# Patient Record
Sex: Male | Born: 2000 | Race: Black or African American | Hispanic: No | Marital: Single | Smoking: Never smoker
Health system: Southern US, Community
[De-identification: ages and names within clinical notes are randomized; demographics above are authoritative.]

## PROBLEM LIST (undated history)

## (undated) DIAGNOSIS — J45909 Unspecified asthma, uncomplicated: Secondary | ICD-10-CM

## (undated) DIAGNOSIS — F909 Attention-deficit hyperactivity disorder, unspecified type: Secondary | ICD-10-CM

## (undated) HISTORY — PX: OTHER SURGICAL HISTORY: SHX169

---
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
## 2020-09-01 IMAGING — CR DG CHEST 2V
2 series · 2 of 2 positions shown · non-contrast
Comparison: 05/30/2011

CLINICAL DATA: Chest pain and cough

EXAM:
CHEST - 2 VIEW

[w chest pa]
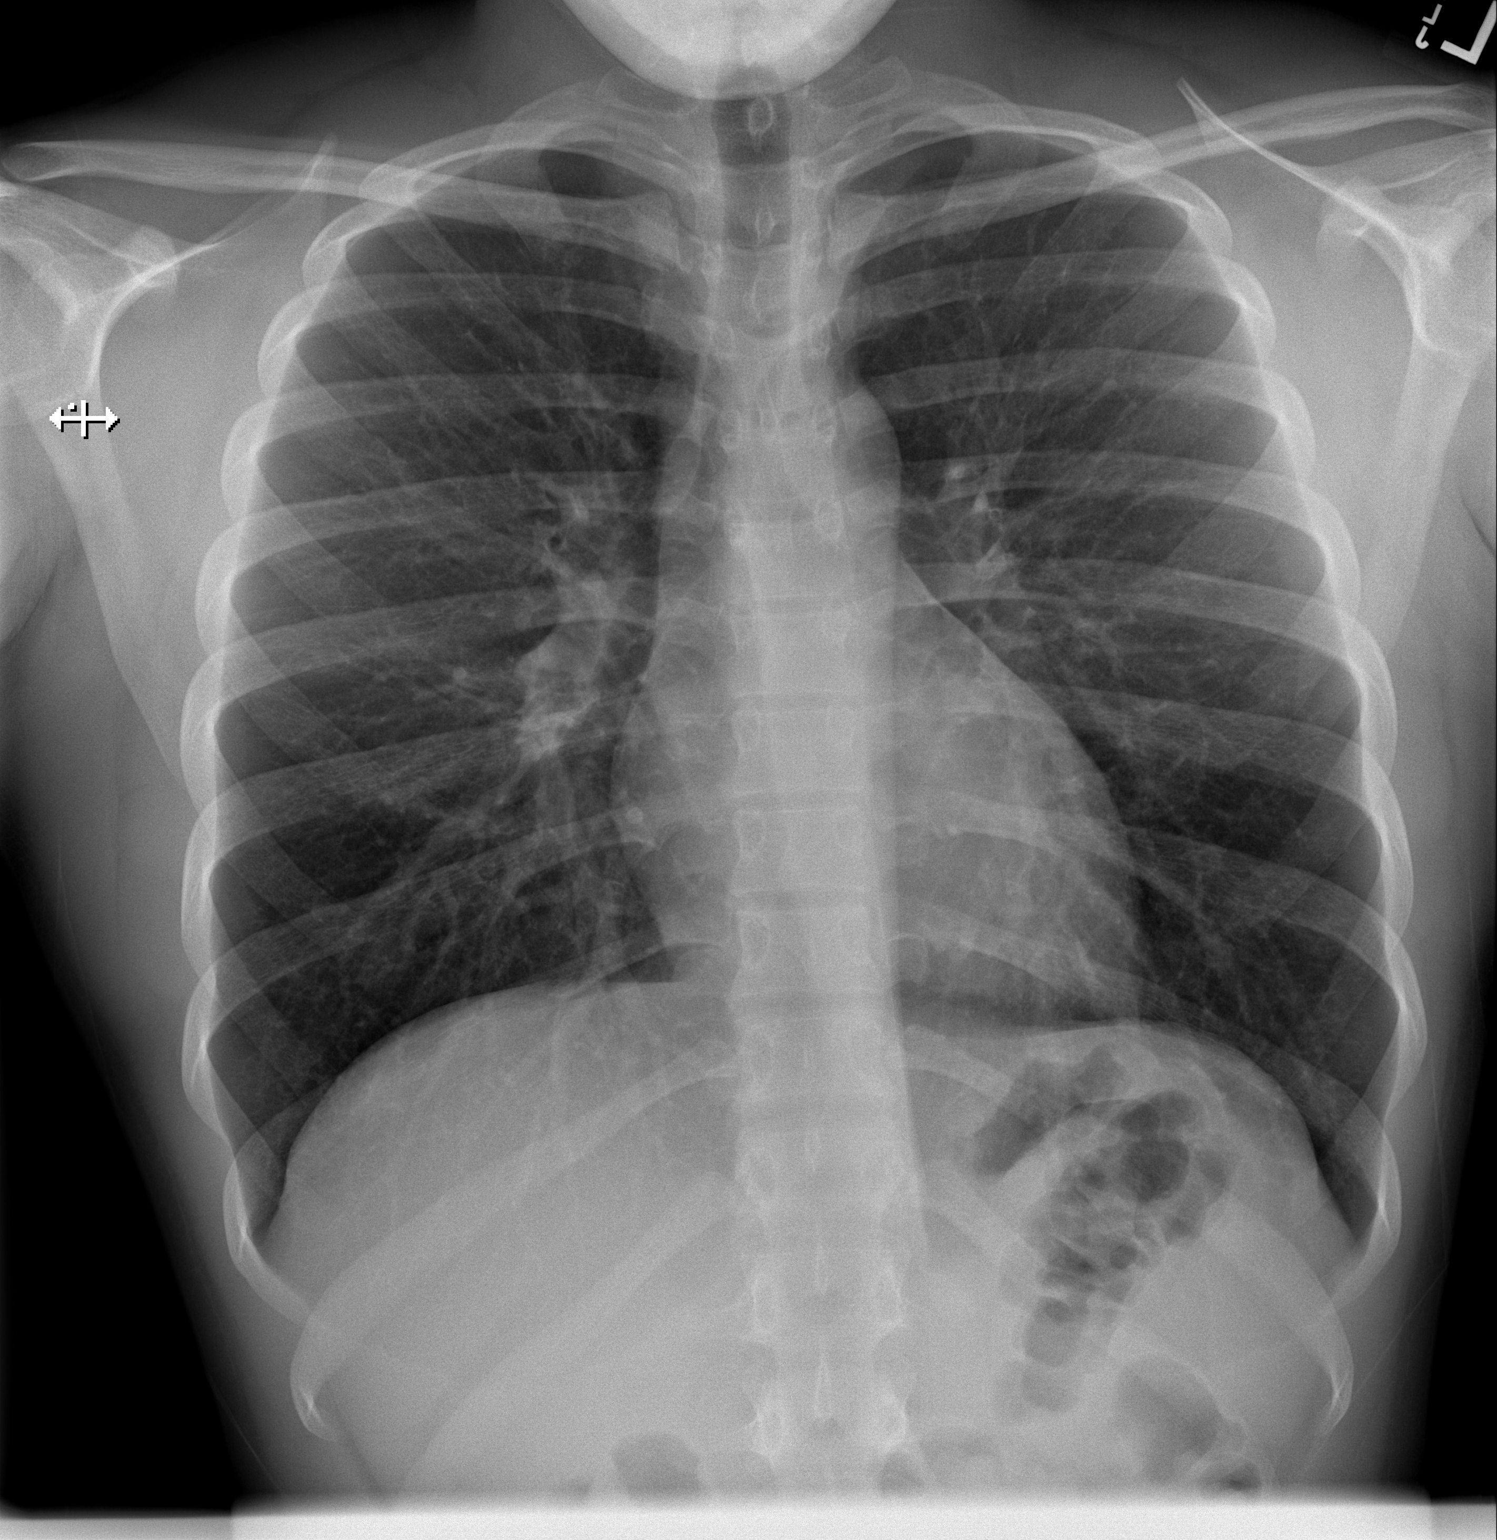

[w chest lat]
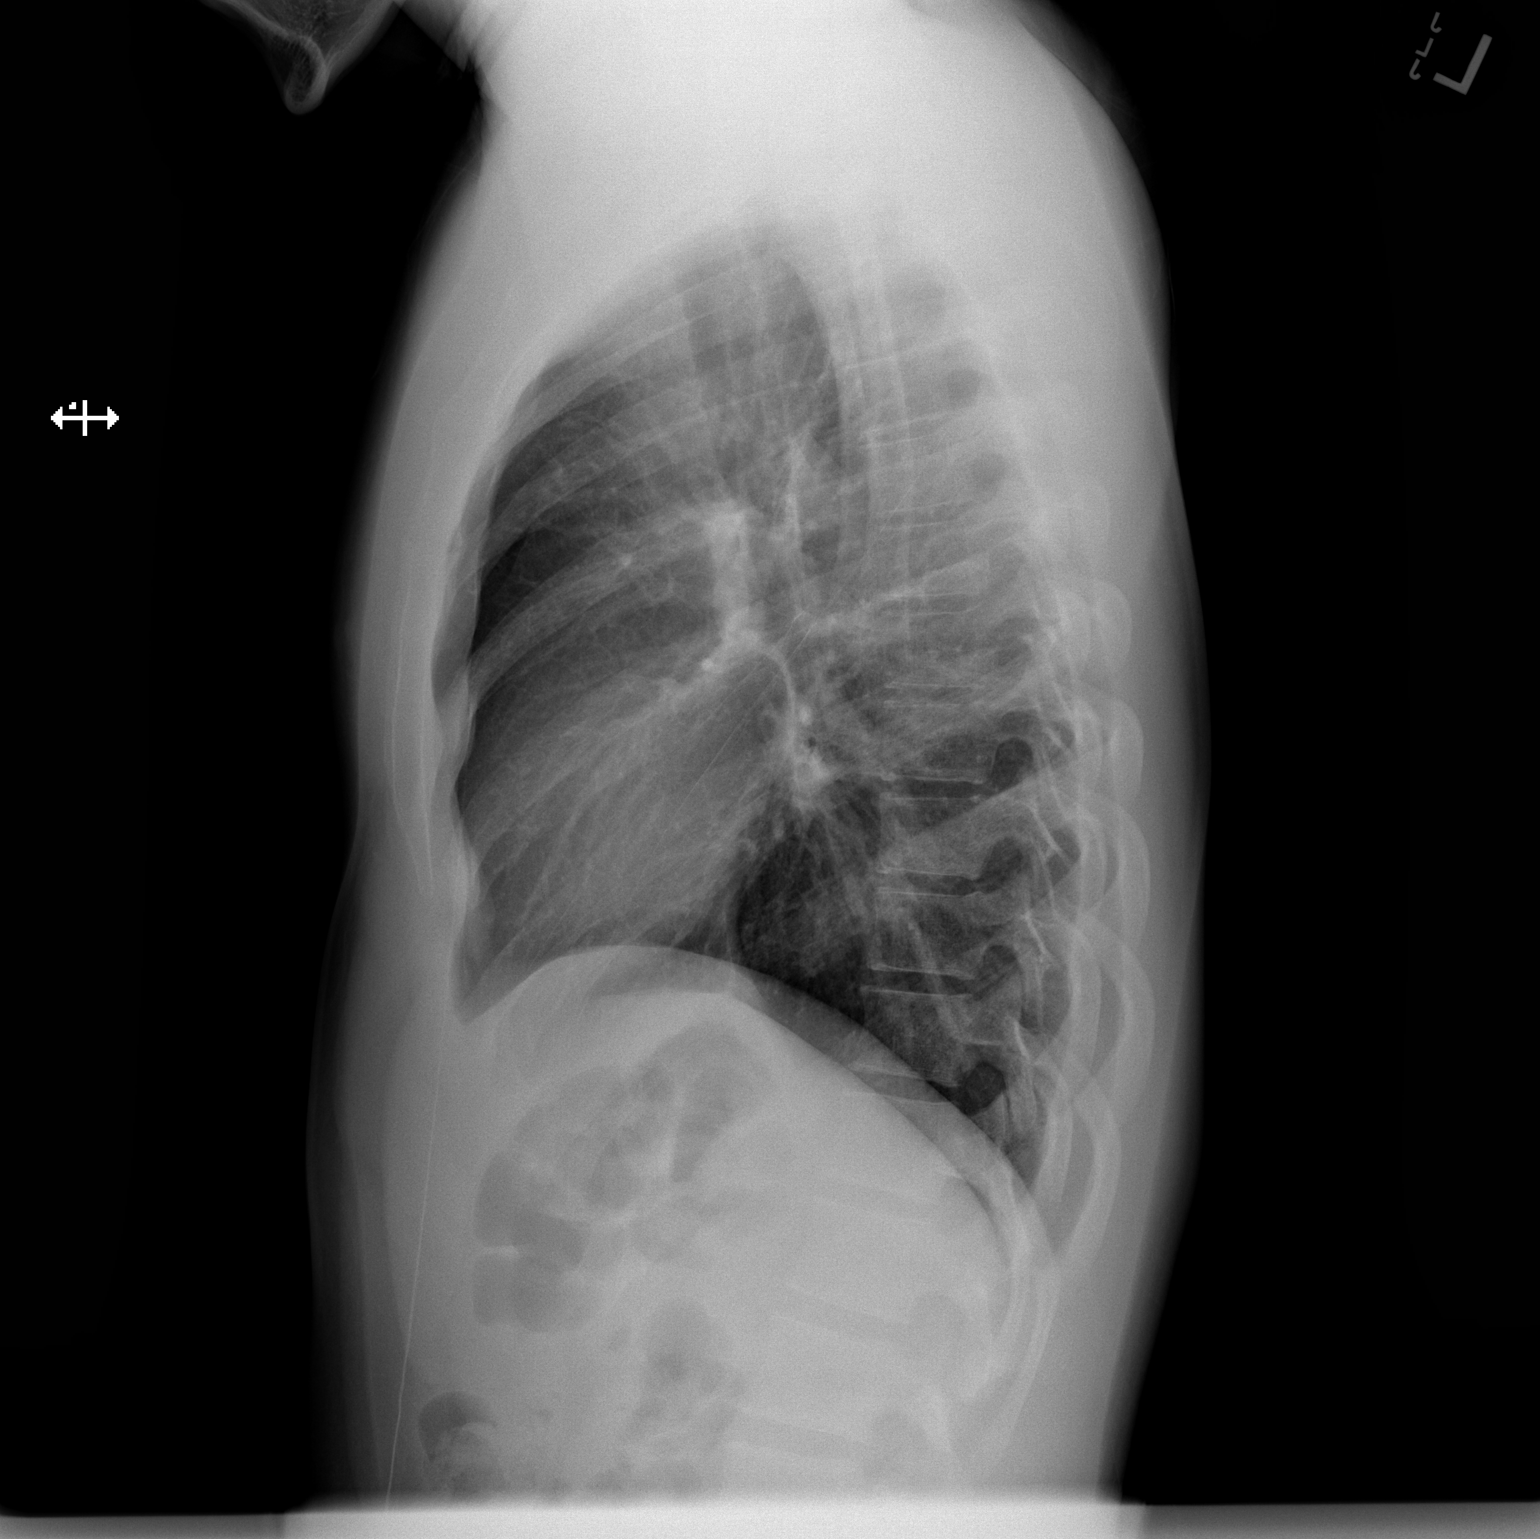

[2 of 2 positions shown; findings below may reference images not displayed]

FINDINGS: Cardiac shadow is within normal limits. The lungs are well aerated
bilaterally. No focal infiltrate or sizable effusion is seen. No
bony abnormality is noted.
IMPRESSION: No active cardiopulmonary disease.
